# Patient Record
Sex: Female | Born: 1950 | Race: White | Hispanic: No
Health system: Southern US, Community
[De-identification: ages and names within clinical notes are randomized; demographics above are authoritative.]

## PROBLEM LIST (undated history)

## (undated) DIAGNOSIS — E119 Type 2 diabetes mellitus without complications: Secondary | ICD-10-CM

## (undated) DIAGNOSIS — E7800 Pure hypercholesterolemia, unspecified: Secondary | ICD-10-CM

## (undated) DIAGNOSIS — I10 Essential (primary) hypertension: Secondary | ICD-10-CM

## (undated) HISTORY — PX: OTHER SURGICAL HISTORY: SHX169

## (undated) HISTORY — PX: ABDOMINAL HYSTERECTOMY: SHX81

## (undated) HISTORY — PX: ORTHOPEDIC SURGERY: SHX850

---

## 2013-09-16 ENCOUNTER — Emergency Department (HOSPITAL_BASED_OUTPATIENT_CLINIC_OR_DEPARTMENT_OTHER)
Admission: EM | Admit: 2013-09-16 | Discharge: 2013-09-16 | Disposition: A | Discharge status: Home / Self Care | Payer: Self-pay | Attending: Emergency Medicine | Admitting: Emergency Medicine

## 2013-09-16 ENCOUNTER — Encounter (HOSPITAL_BASED_OUTPATIENT_CLINIC_OR_DEPARTMENT_OTHER): Payer: Self-pay | Admitting: Emergency Medicine

## 2013-09-16 ENCOUNTER — Emergency Department (HOSPITAL_BASED_OUTPATIENT_CLINIC_OR_DEPARTMENT_OTHER): Payer: Self-pay

## 2013-09-16 DIAGNOSIS — I10 Essential (primary) hypertension: Secondary | ICD-10-CM | POA: Insufficient documentation

## 2013-09-16 DIAGNOSIS — E119 Type 2 diabetes mellitus without complications: Secondary | ICD-10-CM | POA: Insufficient documentation

## 2013-09-16 DIAGNOSIS — M25461 Effusion, right knee: Secondary | ICD-10-CM

## 2013-09-16 DIAGNOSIS — M25469 Effusion, unspecified knee: Secondary | ICD-10-CM | POA: Insufficient documentation

## 2013-09-16 DIAGNOSIS — Y939 Activity, unspecified: Secondary | ICD-10-CM | POA: Insufficient documentation

## 2013-09-16 DIAGNOSIS — X500XXA Overexertion from strenuous movement or load, initial encounter: Secondary | ICD-10-CM | POA: Insufficient documentation

## 2013-09-16 DIAGNOSIS — Y929 Unspecified place or not applicable: Secondary | ICD-10-CM | POA: Insufficient documentation

## 2013-09-16 DIAGNOSIS — M549 Dorsalgia, unspecified: Secondary | ICD-10-CM | POA: Insufficient documentation

## 2013-09-16 HISTORY — DX: Pure hypercholesterolemia, unspecified: E78.00

## 2013-09-16 HISTORY — DX: Type 2 diabetes mellitus without complications: E11.9

## 2013-09-16 HISTORY — DX: Essential (primary) hypertension: I10

## 2013-09-16 MED ORDER — HYDROCODONE-ACETAMINOPHEN 5-325 MG PO TABS: 2.0000 | ORAL_TABLET | ORAL | Status: AC | PRN

## 2013-09-16 MED ORDER — IBUPROFEN 800 MG PO TABS: 800.0000 mg | ORAL_TABLET | Freq: Three times a day (TID) | ORAL | Status: AC

## 2013-09-16 NOTE — ED Provider Notes (Signed)
CSN: [id number]     Arrival date & time 09/16/13  1506 History   First MD Initiated Contact with Patient 09/16/13 1619     Chief Complaint  Patient presents with  . Leg Injury   (Consider location/radiation/quality/duration/timing/severity/associated sxs/prior Treatment) Patient is a 63 y.o. female presenting with knee pain. The history is provided by the patient. No language interpreter was used.  Knee Pain Location:  Leg Injury: no   Leg location:  R leg Pain details:    Quality:  Aching   Radiates to:  Does not radiate   Severity:  Moderate   Onset quality:  Sudden   Progression:  Partially resolved Chronicity:  New Dislocation: no   Foreign body present:  No foreign bodies Tetanus status:  Up to date Associated symptoms: back pain   Risk factors: concern for non-accidental trauma     Past Medical History  Diagnosis Date  . Diabetes mellitus without complication   . Hypertension   . High cholesterol    Past Surgical History  Procedure Laterality Date  . Abdominal hysterectomy    . C- sections    . Orthopedic surgery     No family history on file. History  Substance Use Topics  . Smoking status: Not on file  . Smokeless tobacco: Not on file  . Alcohol Use: Not on file   OB History   Grav Para Term Preterm Abortions TAB SAB Ect Mult Living                 Review of Systems  Musculoskeletal: Positive for back pain.  All other systems reviewed and are negative.    Allergies  Cortisone  Home Medications  No current outpatient prescriptions on file. BP 183/82  Pulse 94  Temp(Src) 98 F (36.7 C) (Oral)  Resp 16  SpO2 100% Physical Exam  Constitutional: She appears well-developed.  Pulmonary/Chest: Effort normal.  Abdominal: Soft.  Musculoskeletal: Normal range of motion. She exhibits tenderness.  Swollen right knee,  Pain with range of motion,  nv and ns intact  Skin: Skin is warm.  Psychiatric: She has a normal mood and affect.    ED Course   Procedures (including critical care time) Labs Review Labs Reviewed - No data to display Imaging Review No results found.  EKG Interpretation   None       MDM  No diagnosis found. Knee imbolizer,  Follow up with Dr. Rayburn MaBlackmon for recheck next week   Elson AreasLeslie K Liadan Guizar, PA-C 09/16/13 1708

## 2013-09-16 NOTE — ED Notes (Signed)
Patient placed in knee immobilizer per order and given care instructions. Patient verbalizes understanding and does not have any additional concerns at this time.

## 2013-09-16 NOTE — Discharge Instructions (Signed)

## 2013-09-16 NOTE — ED Provider Notes (Signed)
Medical screening examination/treatment/procedure(s) were performed by non-physician practitioner and as supervising physician I was immediately available for consultation/collaboration.  EKG Interpretation   None         Osten Janek, MD 09/16/13 1857 

## 2013-09-16 NOTE — ED Notes (Signed)
Pt states that no one has told her results of her tests, with whom to follow up, or any information about what to do after her discharge. Pt requests to speak with Langston MaskerKaren Sofia, Clydie BraunKaren notified.

## 2013-09-16 NOTE — ED Notes (Signed)
12/21almost fell in [place]  Twisted hip ansd had pain from rt hip to ankle.    On the 23rd states rt knee gave way while walking  This past week has twisted ankle several times

## 2013-09-16 NOTE — ED Notes (Signed)
pa at bedside. 

## 2014-09-11 IMAGING — CR DG KNEE COMPLETE 4+V*R*
4 series · 4 of 4 positions shown · non-contrast
Comparison: None

CLINICAL DATA: Medial knee pain and swelling after injury while
walking.

EXAM:
RIGHT KNEE - COMPLETE 4+ VIEW

[t knee ap right]
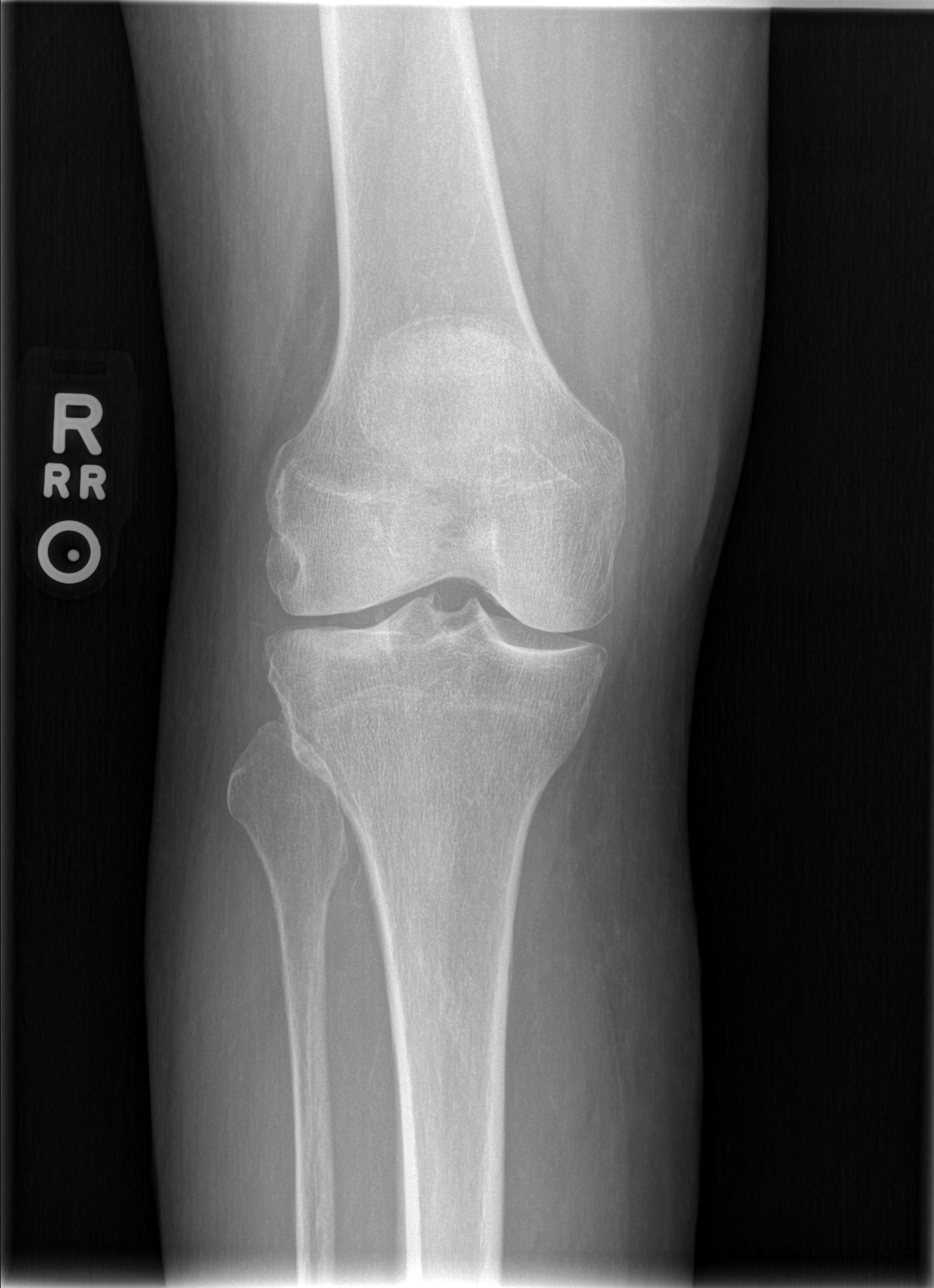

[t knee oblique right (1 of 2)]
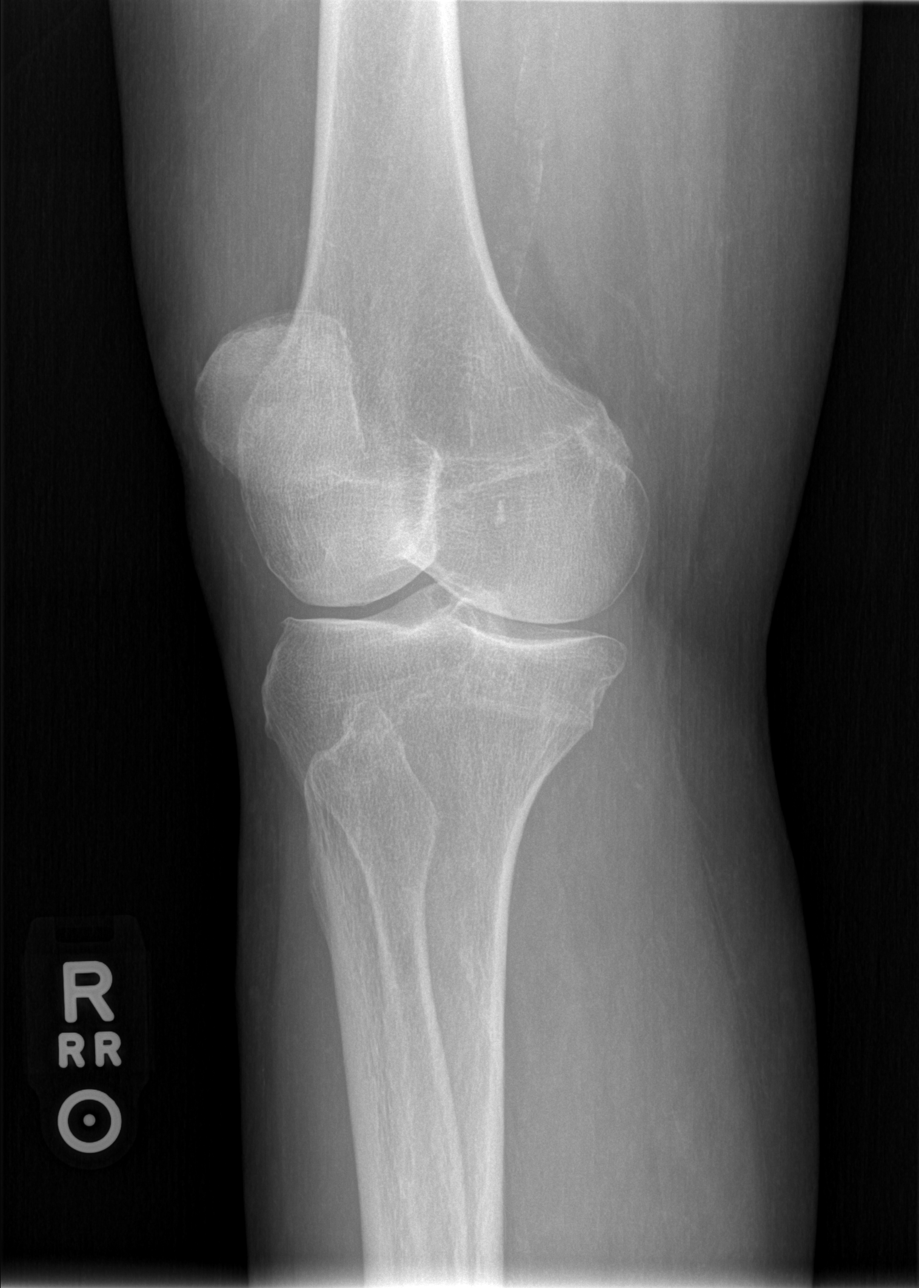

[t knee oblique right (2 of 2)]
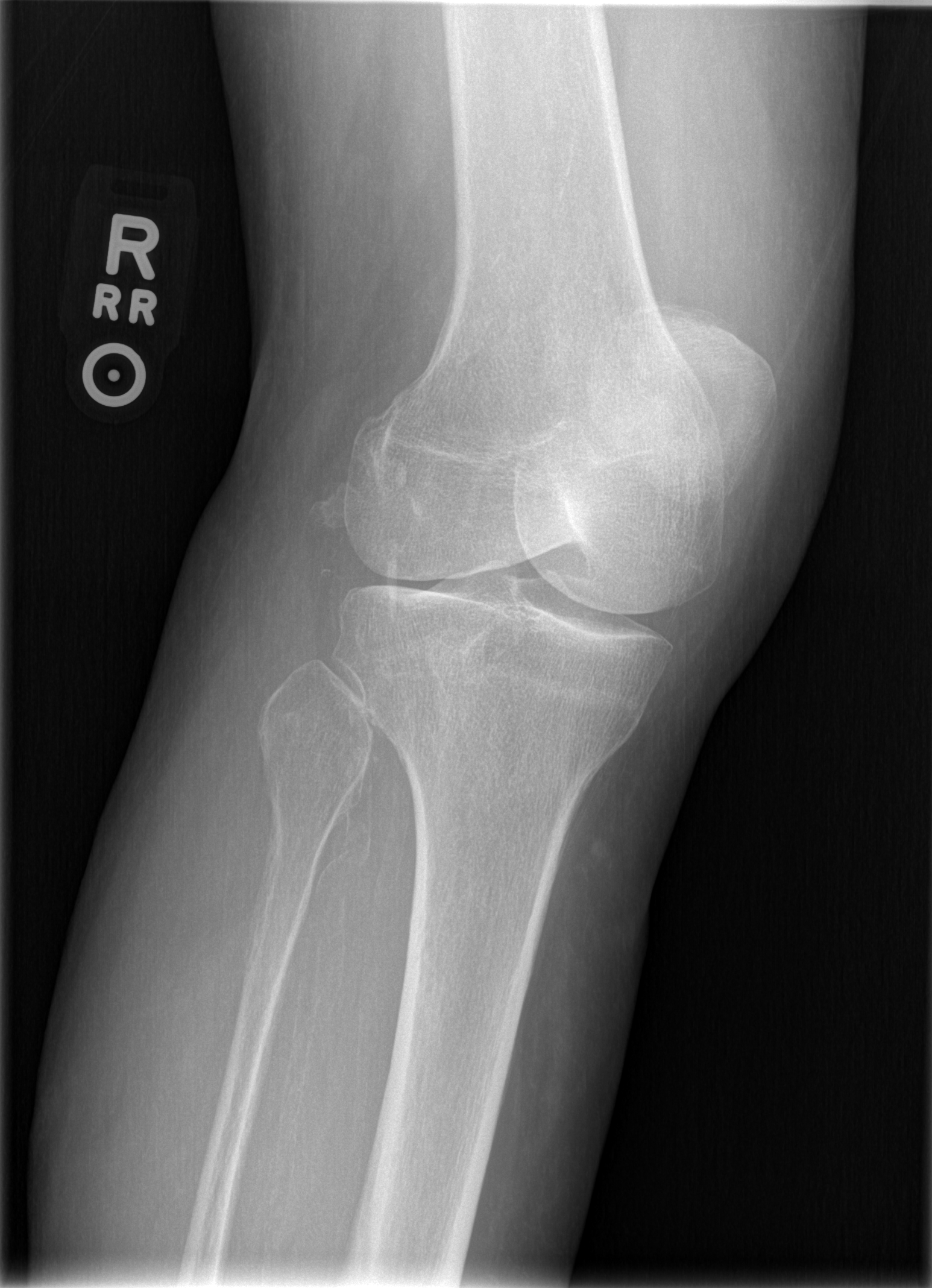

[t knee lat right]
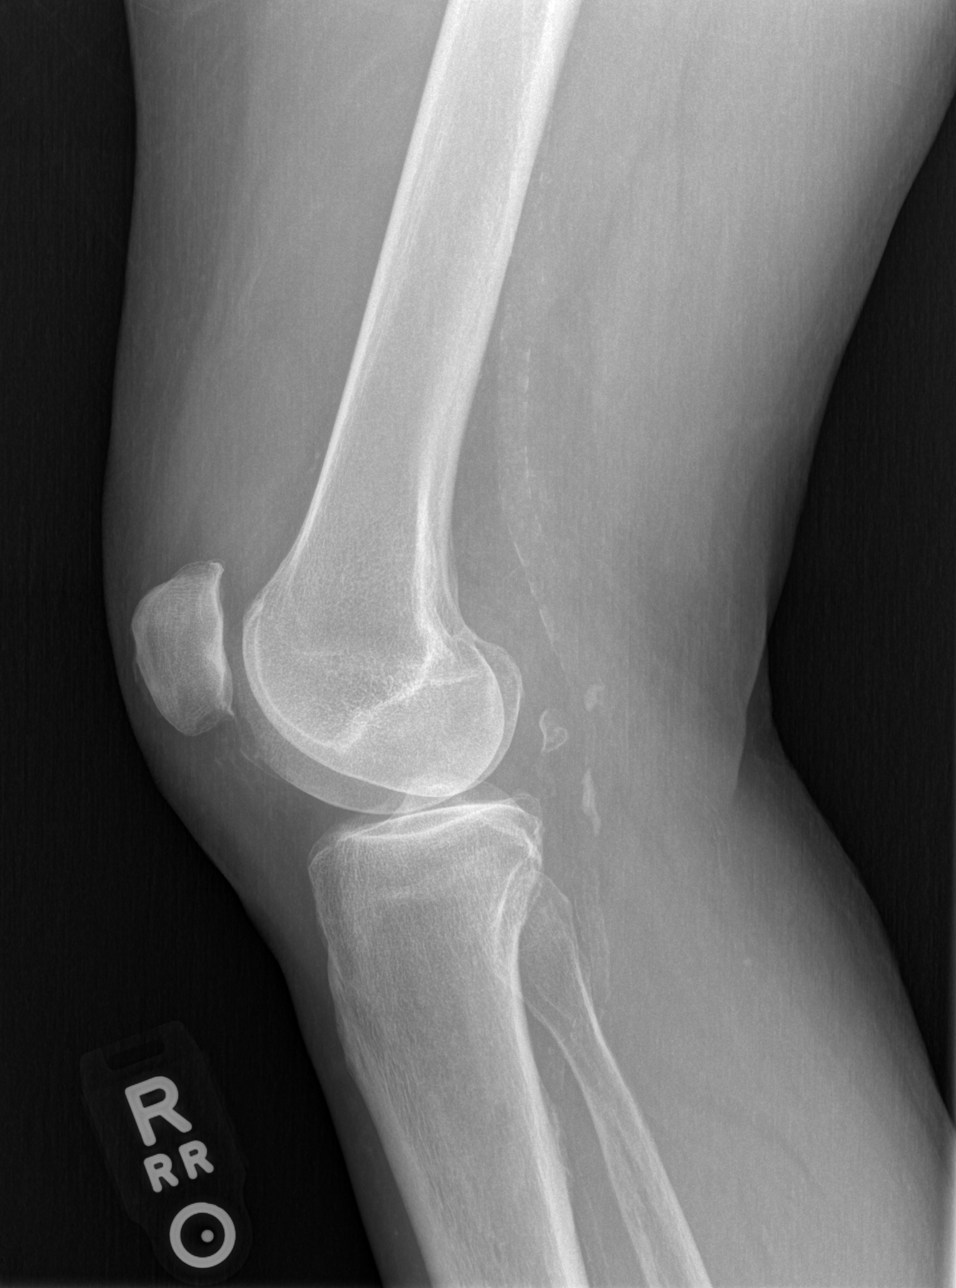

[4 of 4 positions shown; findings below may reference images not displayed]

FINDINGS: Popliteal and distal superficial femoral artery atherosclerotic
calcification noted.

Mild patellofemoral spurring. Mild medial and lateral compartmental
loss of articular space.

Large knee effusion in the suprapatellar bursa.

No fracture identified.
IMPRESSION: 1. Large knee effusion without visible fracture.
2. Moderate osteoarthritis.
3. Prominent atherosclerotic calcification.
